# Patient Record
Sex: Female | Born: 2000 | Race: Black or African American | Hispanic: No | Marital: Single | Smoking: Never smoker
Health system: Southern US, Community
[De-identification: ages and names within clinical notes are randomized; demographics above are authoritative.]

## PROBLEM LIST (undated history)

## (undated) DIAGNOSIS — M89761 Major osseous defect, right lower leg: Secondary | ICD-10-CM

## (undated) DIAGNOSIS — R51 Headache: Secondary | ICD-10-CM

## (undated) DIAGNOSIS — R519 Headache, unspecified: Secondary | ICD-10-CM

## (undated) DIAGNOSIS — M13161 Monoarthritis, not elsewhere classified, right knee: Secondary | ICD-10-CM

## (undated) DIAGNOSIS — M2241 Chondromalacia patellae, right knee: Secondary | ICD-10-CM

## (undated) DIAGNOSIS — R55 Syncope and collapse: Secondary | ICD-10-CM

## (undated) DIAGNOSIS — K219 Gastro-esophageal reflux disease without esophagitis: Secondary | ICD-10-CM

## (undated) DIAGNOSIS — J302 Other seasonal allergic rhinitis: Secondary | ICD-10-CM

---
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
## 2018-08-21 IMAGING — DX DG CHEST 2V
2 series · 2 of 2 positions shown · non-contrast
Comparison: Prior radiograph from 03/10/2016.

CLINICAL DATA: Initial evaluation for acute chest pain.

EXAM:
CHEST  2 VIEW

[chest pa]
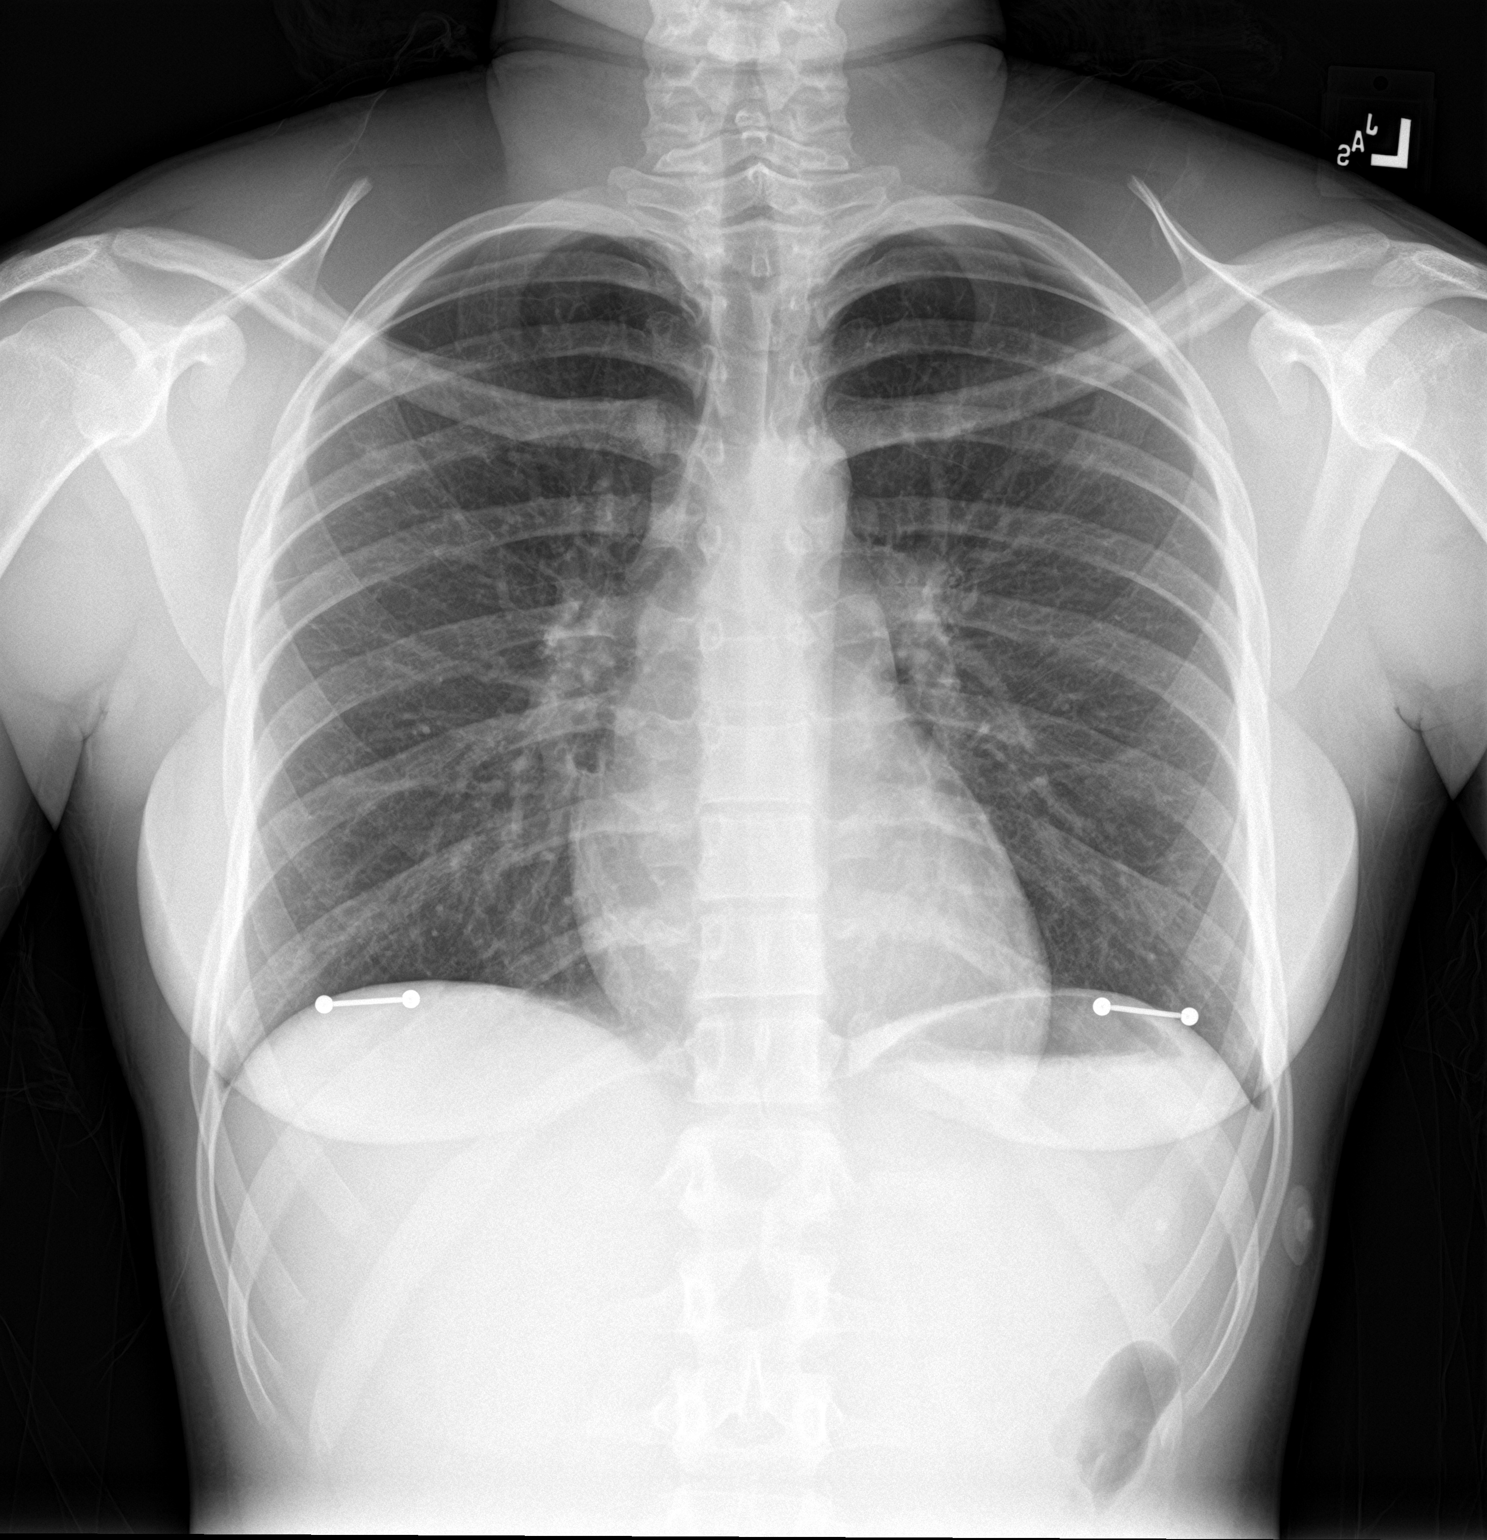

[chest lat]
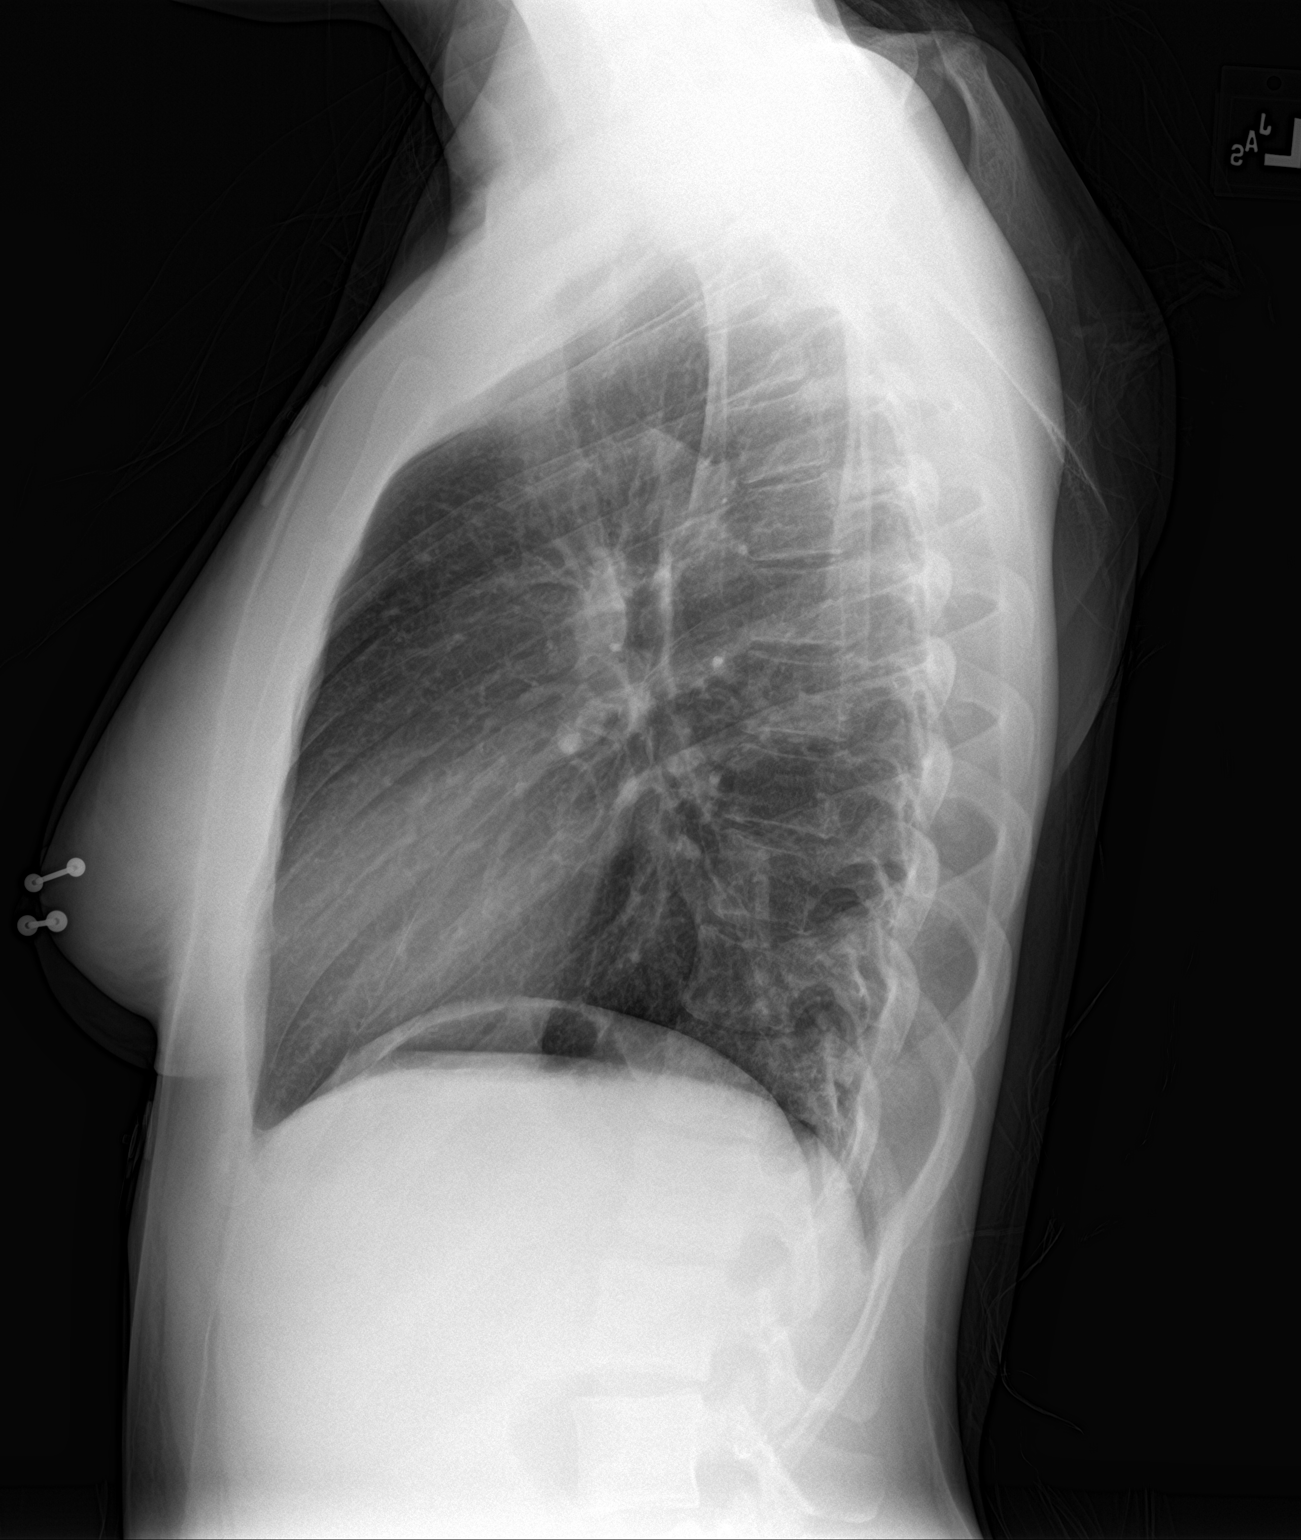

[2 of 2 positions shown; findings below may reference images not displayed]

FINDINGS: The cardiac and mediastinal silhouettes are stable in size and
contour, and remain within normal limits.

The lungs are normally inflated. No airspace consolidation, pleural
effusion, or pulmonary edema is identified. There is no
pneumothorax.

No acute osseous abnormality identified.
IMPRESSION: No active cardiopulmonary disease.

## 2019-06-29 ENCOUNTER — Other Ambulatory Visit: Payer: Self-pay

## 2019-06-29 DIAGNOSIS — Z20822 Contact with and (suspected) exposure to covid-19: Secondary | ICD-10-CM

## 2019-07-01 LAB — NOVEL CORONAVIRUS, NAA: SARS-CoV-2, NAA: NOT DETECTED

## 2020-04-25 ENCOUNTER — Ambulatory Visit: Payer: 59 | Admitting: Family Medicine

## 2020-04-26 ENCOUNTER — Other Ambulatory Visit: Payer: Self-pay

## 2020-04-26 ENCOUNTER — Encounter: Payer: Self-pay | Admitting: Family Medicine

## 2020-04-26 ENCOUNTER — Ambulatory Visit (INDEPENDENT_AMBULATORY_CARE_PROVIDER_SITE_OTHER): Payer: BC Managed Care – PPO | Admitting: Family Medicine

## 2020-04-26 DIAGNOSIS — M542 Cervicalgia: Secondary | ICD-10-CM

## 2020-04-26 DIAGNOSIS — S42124A Nondisplaced fracture of acromial process, right shoulder, initial encounter for closed fracture: Secondary | ICD-10-CM | POA: Diagnosis not present

## 2020-04-26 DIAGNOSIS — M25511 Pain in right shoulder: Secondary | ICD-10-CM

## 2020-04-26 DIAGNOSIS — S42123A Displaced fracture of acromial process, unspecified shoulder, initial encounter for closed fracture: Secondary | ICD-10-CM | POA: Insufficient documentation

## 2020-04-26 MED ORDER — TIZANIDINE HCL 2 MG PO TABS: 2.0000 mg | ORAL_TABLET | Freq: Four times a day (QID) | ORAL | 1 refills | Status: DC | PRN
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
## 2020-05-17 ENCOUNTER — Ambulatory Visit: Payer: Self-pay

## 2020-05-17 ENCOUNTER — Ambulatory Visit (INDEPENDENT_AMBULATORY_CARE_PROVIDER_SITE_OTHER): Payer: BC Managed Care – PPO | Admitting: Family Medicine

## 2020-05-17 ENCOUNTER — Other Ambulatory Visit: Payer: Self-pay

## 2020-05-17 ENCOUNTER — Encounter: Payer: Self-pay | Admitting: Family Medicine

## 2020-05-17 DIAGNOSIS — S42124A Nondisplaced fracture of acromial process, right shoulder, initial encounter for closed fracture: Secondary | ICD-10-CM
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
## 2020-06-06 ENCOUNTER — Emergency Department (HOSPITAL_COMMUNITY)
Admission: EM | Admit: 2020-06-06 | Discharge: 2020-06-06 | Disposition: A | Discharge status: Home / Self Care | Payer: BC Managed Care – PPO | Attending: Emergency Medicine | Admitting: Emergency Medicine

## 2020-06-06 DIAGNOSIS — Y939 Activity, unspecified: Secondary | ICD-10-CM | POA: Insufficient documentation

## 2020-06-06 DIAGNOSIS — W503XXA Accidental bite by another person, initial encounter: Secondary | ICD-10-CM | POA: Insufficient documentation

## 2020-06-06 DIAGNOSIS — Y999 Unspecified external cause status: Secondary | ICD-10-CM | POA: Diagnosis not present

## 2020-06-06 DIAGNOSIS — Y929 Unspecified place or not applicable: Secondary | ICD-10-CM | POA: Insufficient documentation

## 2020-06-06 DIAGNOSIS — S0181XA Laceration without foreign body of other part of head, initial encounter: Secondary | ICD-10-CM | POA: Diagnosis present

## 2020-06-06 DIAGNOSIS — Z79899 Other long term (current) drug therapy: Secondary | ICD-10-CM | POA: Insufficient documentation

## 2020-06-06 LAB — POC URINE PREG, ED: Preg Test, Ur: NEGATIVE

## 2020-06-06 MED ORDER — LIDOCAINE-EPINEPHRINE-TETRACAINE (LET) TOPICAL GEL
3.0000 mL | Freq: Once | TOPICAL | Status: AC
  Administered 2020-06-06: 3 mL via TOPICAL
  Filled 2020-06-06: qty 3

## 2020-06-06 MED ORDER — IBUPROFEN 400 MG PO TABS
600.0000 mg | ORAL_TABLET | Freq: Once | ORAL | Status: AC
  Administered 2020-06-06: 600 mg via ORAL
  Filled 2020-06-06: qty 1

## 2020-06-06 MED ORDER — ACETAMINOPHEN 500 MG PO TABS
1000.0000 mg | ORAL_TABLET | Freq: Once | ORAL | Status: AC
  Administered 2020-06-06: 1000 mg via ORAL
  Filled 2020-06-06: qty 2

## 2020-06-06 MED ORDER — LIDOCAINE HCL (PF) 1 % IJ SOLN
INTRAMUSCULAR | Status: AC
  Filled 2020-06-06: qty 30

## 2020-06-06 MED ORDER — AMOXICILLIN-POT CLAVULANATE 875-125 MG PO TABS
1.0000 | ORAL_TABLET | Freq: Once | ORAL | Status: AC
  Administered 2020-06-06: 1 via ORAL
  Filled 2020-06-06: qty 1

## 2020-06-06 MED ORDER — NAPROXEN 375 MG PO TABS: 375.0000 mg | ORAL_TABLET | Freq: Two times a day (BID) | ORAL | 0 refills | Status: AC

## 2020-06-06 MED ORDER — AMOXICILLIN-POT CLAVULANATE 875-125 MG PO TABS: 1.0000 | ORAL_TABLET | Freq: Two times a day (BID) | ORAL | 0 refills | Status: DC
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
## 2020-06-06 NOTE — ED Notes (Signed)
Pt asking how long the wait  shes hungry  Policy quoted to her   Asking if she can go elsewhere  That has to be her decision
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
## 2020-06-06 NOTE — ED Notes (Signed)
Suture cart to bedside. 
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
## 2024-08-30 ENCOUNTER — Encounter (HOSPITAL_BASED_OUTPATIENT_CLINIC_OR_DEPARTMENT_OTHER): Payer: Self-pay | Admitting: Urology

## 2024-08-30 ENCOUNTER — Emergency Department (HOSPITAL_BASED_OUTPATIENT_CLINIC_OR_DEPARTMENT_OTHER): Payer: MEDICAID

## 2024-08-30 ENCOUNTER — Other Ambulatory Visit: Payer: Self-pay

## 2024-08-30 ENCOUNTER — Emergency Department (HOSPITAL_BASED_OUTPATIENT_CLINIC_OR_DEPARTMENT_OTHER)
Admission: EM | Admit: 2024-08-30 | Discharge: 2024-08-30 | Disposition: A | Discharge status: Home / Self Care | Payer: MEDICAID | Attending: Emergency Medicine | Admitting: Emergency Medicine

## 2024-08-30 DIAGNOSIS — R0789 Other chest pain: Secondary | ICD-10-CM | POA: Diagnosis present

## 2024-08-30 DIAGNOSIS — R079 Chest pain, unspecified: Secondary | ICD-10-CM

## 2024-08-30 LAB — BASIC METABOLIC PANEL WITH GFR
Anion gap: 11 (ref 5–15)
BUN: 18 mg/dL (ref 6–20)
CO2: 22 mmol/L (ref 22–32)
Calcium: 9 mg/dL (ref 8.9–10.3)
Chloride: 102 mmol/L (ref 98–111)
Creatinine, Ser: 1 mg/dL (ref 0.44–1.00)
GFR, Estimated: >60 mL/min (ref >60)
Glucose, Bld: 93 mg/dL (ref 70–99)
Potassium: 4.1 mmol/L (ref 3.5–5.1)
Sodium: 135 mmol/L (ref 135–145)

## 2024-08-30 LAB — TROPONIN T, HIGH SENSITIVITY: Troponin T High Sensitivity: <15 ng/L (ref 0–19)

## 2024-08-30 LAB — CBC
HCT: 36.8 % (ref 36.0–46.0)
Hemoglobin: 12.7 g/dL (ref 12.0–15.0)
MCH: 31 pg (ref 26.0–34.0)
MCHC: 34.5 g/dL (ref 30.0–36.0)
MCV: 89.8 fL (ref 80.0–100.0)
Platelets: 234 K/uL (ref 150–400)
RBC: 4.1 MIL/uL (ref 3.87–5.11)
RDW: 12.2 % (ref 11.5–15.5)
WBC: 6 K/uL (ref 4.0–10.5)
nRBC: 0 % (ref 0.0–0.2)

## 2024-08-30 LAB — RESP PANEL BY RT-PCR (RSV, FLU A&B, COVID)  RVPGX2
Influenza A by PCR: NEGATIVE
Influenza B by PCR: NEGATIVE
Resp Syncytial Virus by PCR: NEGATIVE
SARS Coronavirus 2 by RT PCR: NEGATIVE

## 2024-08-30 LAB — PREGNANCY, URINE: Preg Test, Ur: NEGATIVE

## 2024-08-30 LAB — D-DIMER, QUANTITATIVE: D-Dimer, Quant: <0.27 ug{FEU}/mL (ref 0.00–0.50)

## 2024-08-30 MED ORDER — CELECOXIB 200 MG PO CAPS: 200.0000 mg | ORAL_CAPSULE | Freq: Two times a day (BID) | ORAL | 0 refills | Status: AC | PRN

## 2024-08-30 MED ORDER — HYDROXYZINE HCL 25 MG PO TABS
25.0000 mg | ORAL_TABLET | Freq: Once | ORAL | Status: AC
  Administered 2024-08-30: 25 mg via ORAL
  Filled 2024-08-30: qty 1

## 2024-08-30 MED ORDER — SODIUM CHLORIDE 0.9 % IV BOLUS
1000.0000 mL | Freq: Once | INTRAVENOUS | Status: AC
  Administered 2024-08-30: 1000 mL via INTRAVENOUS

## 2024-08-30 MED ORDER — KETOROLAC TROMETHAMINE 15 MG/ML IJ SOLN
15.0000 mg | Freq: Once | INTRAMUSCULAR | Status: AC
  Administered 2024-08-30: 15 mg via INTRAVENOUS
  Filled 2024-08-30: qty 1

## 2024-08-30 MED ORDER — CYCLOBENZAPRINE HCL 10 MG PO TABS: 10.0000 mg | ORAL_TABLET | Freq: Two times a day (BID) | ORAL | 0 refills | Status: AC | PRN

## 2024-08-30 NOTE — Discharge Instructions (Addendum)
 As discussed, your workup today was reassuring.  Heart enzyme, EKG appeared normal.  D-dimer for screening test for blood clot Was normal as well.  Chest x-ray did not show obvious collapsed lung, fluid on the lung, pneumonia or other abnormality.  Suspect musculoskeletal etiology of your discomfort.  Will send you with a couple of medicines to treat your symptoms.  Recommend follow-up with primary care for reassessment.

## 2024-08-30 NOTE — ED Triage Notes (Signed)
 Left sided chest pain x 25 hours, also states left breast swelling noticed States feeling sweaty and anxious and short of breath  States h/o panic attacks  Reports being under a lot of stress
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
## 2024-08-30 NOTE — ED Notes (Signed)
 Pt requested for IV fluids to be stopped due to her arm being cold
